# Patient Record
Sex: Female | Born: 1940 | Race: White | Hispanic: No | Marital: Single
Health system: Southern US, Community
[De-identification: ages and names within clinical notes are randomized; demographics above are authoritative.]

## PROBLEM LIST (undated history)

## (undated) DIAGNOSIS — K567 Ileus, unspecified: Secondary | ICD-10-CM

## (undated) DIAGNOSIS — A419 Sepsis, unspecified organism: Secondary | ICD-10-CM

## (undated) DIAGNOSIS — I10 Essential (primary) hypertension: Secondary | ICD-10-CM

## (undated) DIAGNOSIS — C20 Malignant neoplasm of rectum: Secondary | ICD-10-CM

## (undated) DIAGNOSIS — D75839 Thrombocytosis, unspecified: Secondary | ICD-10-CM

## (undated) DIAGNOSIS — I509 Heart failure, unspecified: Secondary | ICD-10-CM

## (undated) DIAGNOSIS — D473 Essential (hemorrhagic) thrombocythemia: Secondary | ICD-10-CM

---

## 2014-02-19 ENCOUNTER — Inpatient Hospital Stay
Admission: AD | Admit: 2014-02-19 | Discharge: 2014-02-24 | Disposition: A | Discharge status: Still In Hospital | Payer: Medicare Other | Source: Ambulatory Visit | Attending: Internal Medicine | Admitting: Internal Medicine

## 2014-02-20 ENCOUNTER — Other Ambulatory Visit (HOSPITAL_COMMUNITY): Payer: Medicare Other

## 2014-02-20 LAB — CBC
HEMATOCRIT: 22.1 % — AB (ref 36.0–46.0)
Hemoglobin: 7.7 g/dL — ABNORMAL LOW (ref 12.0–15.0)
MCH: 31.7 pg (ref 26.0–34.0)
MCHC: 34.8 g/dL (ref 30.0–36.0)
MCV: 90.9 fL (ref 78.0–100.0)
Platelets: 52 10*3/uL — ABNORMAL LOW (ref 150–400)
RBC: 2.43 MIL/uL — AB (ref 3.87–5.11)
RDW: 20.9 % — ABNORMAL HIGH (ref 11.5–15.5)
WBC: 9.2 10*3/uL (ref 4.0–10.5)

## 2014-02-20 LAB — COMPREHENSIVE METABOLIC PANEL
ALK PHOS: 58 U/L (ref 39–117)
ALT: 38 U/L — ABNORMAL HIGH (ref 0–35)
AST: 66 U/L — ABNORMAL HIGH (ref 0–37)
Albumin: 3.1 g/dL — ABNORMAL LOW (ref 3.5–5.2)
Anion gap: 9 (ref 5–15)
BILIRUBIN TOTAL: 0.5 mg/dL (ref 0.3–1.2)
BUN: 27 mg/dL — ABNORMAL HIGH (ref 6–23)
CHLORIDE: 106 meq/L (ref 96–112)
CO2: 26 mEq/L (ref 19–32)
Calcium: 7.6 mg/dL — ABNORMAL LOW (ref 8.4–10.5)
Creatinine, Ser: 0.36 mg/dL — ABNORMAL LOW (ref 0.50–1.10)
GFR calc non Af Amer: >90 mL/min (ref >90)
GLUCOSE: 220 mg/dL — AB (ref 70–99)
POTASSIUM: 3.2 meq/L — AB (ref 3.7–5.3)
Sodium: 141 mEq/L (ref 137–147)
Total Protein: 4.1 g/dL — ABNORMAL LOW (ref 6.0–8.3)

## 2014-02-20 LAB — TSH: TSH: 3.64 u[IU]/mL (ref 0.350–4.500)

## 2014-02-20 LAB — PREALBUMIN: Prealbumin: 18.6 mg/dL (ref 17.0–34.0)

## 2014-02-21 ENCOUNTER — Other Ambulatory Visit (HOSPITAL_COMMUNITY): Payer: Medicare Other

## 2014-02-21 LAB — CBC
HEMATOCRIT: 22.6 % — AB (ref 36.0–46.0)
Hemoglobin: 7.6 g/dL — ABNORMAL LOW (ref 12.0–15.0)
MCH: 31.5 pg (ref 26.0–34.0)
MCHC: 33.6 g/dL (ref 30.0–36.0)
MCV: 93.8 fL (ref 78.0–100.0)
Platelets: 51 10*3/uL — ABNORMAL LOW (ref 150–400)
RBC: 2.41 MIL/uL — AB (ref 3.87–5.11)
RDW: 21 % — ABNORMAL HIGH (ref 11.5–15.5)
WBC: 8.2 10*3/uL (ref 4.0–10.5)

## 2014-02-21 LAB — BASIC METABOLIC PANEL
Anion gap: 9 (ref 5–15)
BUN: 31 mg/dL — ABNORMAL HIGH (ref 6–23)
CALCIUM: 7.3 mg/dL — AB (ref 8.4–10.5)
CO2: 28 meq/L (ref 19–32)
Chloride: 103 mEq/L (ref 96–112)
Creatinine, Ser: 0.35 mg/dL — ABNORMAL LOW (ref 0.50–1.10)
GFR calc Af Amer: >90 mL/min (ref >90)
GFR calc non Af Amer: >90 mL/min (ref >90)
GLUCOSE: 221 mg/dL — AB (ref 70–99)
Potassium: 3 mEq/L — ABNORMAL LOW (ref 3.7–5.3)
Sodium: 140 mEq/L (ref 137–147)

## 2014-02-22 LAB — COMPREHENSIVE METABOLIC PANEL
ALBUMIN: 3.4 g/dL — AB (ref 3.5–5.2)
ALT: 42 U/L — AB (ref 0–35)
AST: 53 U/L — AB (ref 0–37)
Alkaline Phosphatase: 60 U/L (ref 39–117)
Anion gap: 12 (ref 5–15)
BUN: 32 mg/dL — ABNORMAL HIGH (ref 6–23)
CALCIUM: 7.9 mg/dL — AB (ref 8.4–10.5)
CO2: 27 mEq/L (ref 19–32)
CREATININE: 0.32 mg/dL — AB (ref 0.50–1.10)
Chloride: 103 mEq/L (ref 96–112)
GFR calc Af Amer: >90 mL/min (ref >90)
GFR calc non Af Amer: >90 mL/min (ref >90)
Glucose, Bld: 209 mg/dL — ABNORMAL HIGH (ref 70–99)
Potassium: 2.9 mEq/L — CL (ref 3.7–5.3)
Sodium: 142 mEq/L (ref 137–147)
Total Bilirubin: 0.5 mg/dL (ref 0.3–1.2)
Total Protein: 4.4 g/dL — ABNORMAL LOW (ref 6.0–8.3)

## 2014-02-22 LAB — MAGNESIUM: MAGNESIUM: 1.7 mg/dL (ref 1.5–2.5)

## 2014-02-22 LAB — PHOSPHORUS: PHOSPHORUS: 1.9 mg/dL — AB (ref 2.3–4.6)

## 2014-02-23 ENCOUNTER — Other Ambulatory Visit (HOSPITAL_COMMUNITY): Payer: Medicare Other

## 2014-02-23 LAB — BASIC METABOLIC PANEL
ANION GAP: 11 (ref 5–15)
BUN: 37 mg/dL — ABNORMAL HIGH (ref 6–23)
CHLORIDE: 107 meq/L (ref 96–112)
CO2: 27 meq/L (ref 19–32)
Calcium: 8.4 mg/dL (ref 8.4–10.5)
Creatinine, Ser: 0.34 mg/dL — ABNORMAL LOW (ref 0.50–1.10)
GFR calc non Af Amer: >90 mL/min (ref >90)
Glucose, Bld: 217 mg/dL — ABNORMAL HIGH (ref 70–99)
POTASSIUM: 5 meq/L (ref 3.7–5.3)
SODIUM: 145 meq/L (ref 137–147)

## 2014-02-23 LAB — BLOOD GAS, ARTERIAL
ACID-BASE EXCESS: 5.3 mmol/L — AB (ref 0.0–2.0)
Bicarbonate: 28.9 mEq/L — ABNORMAL HIGH (ref 20.0–24.0)
O2 CONTENT: 3 L/min
O2 SAT: 92.4 %
PCO2 ART: 38.4 mmHg (ref 35.0–45.0)
Patient temperature: 97.6
TCO2: 30.1 mmol/L (ref 0–100)
pH, Arterial: 7.486 — ABNORMAL HIGH (ref 7.350–7.450)
pO2, Arterial: 62 mmHg — ABNORMAL LOW (ref 80.0–100.0)

## 2014-02-23 LAB — MAGNESIUM: MAGNESIUM: 2 mg/dL (ref 1.5–2.5)

## 2014-02-23 LAB — PHOSPHORUS: PHOSPHORUS: 2.8 mg/dL (ref 2.3–4.6)

## 2014-02-24 ENCOUNTER — Encounter (HOSPITAL_COMMUNITY): Payer: Self-pay | Admitting: Emergency Medicine

## 2014-02-24 ENCOUNTER — Other Ambulatory Visit (HOSPITAL_COMMUNITY): Payer: Medicare Other

## 2014-02-24 ENCOUNTER — Emergency Department (HOSPITAL_COMMUNITY): Payer: Medicare Other

## 2014-02-24 ENCOUNTER — Inpatient Hospital Stay (HOSPITAL_COMMUNITY)
Admission: EM | Admit: 2014-02-24 | Discharge: 2014-03-13 | DRG: 871 | Disposition: E | Payer: Medicare Other | Source: Other Acute Inpatient Hospital | Attending: Critical Care Medicine | Admitting: Critical Care Medicine

## 2014-02-24 DIAGNOSIS — Z9221 Personal history of antineoplastic chemotherapy: Secondary | ICD-10-CM | POA: Diagnosis not present

## 2014-02-24 DIAGNOSIS — J9600 Acute respiratory failure, unspecified whether with hypoxia or hypercapnia: Secondary | ICD-10-CM | POA: Diagnosis present

## 2014-02-24 DIAGNOSIS — K668 Other specified disorders of peritoneum: Secondary | ICD-10-CM | POA: Diagnosis present

## 2014-02-24 DIAGNOSIS — I214 Non-ST elevation (NSTEMI) myocardial infarction: Secondary | ICD-10-CM | POA: Diagnosis present

## 2014-02-24 DIAGNOSIS — I10 Essential (primary) hypertension: Secondary | ICD-10-CM | POA: Diagnosis present

## 2014-02-24 DIAGNOSIS — I509 Heart failure, unspecified: Secondary | ICD-10-CM | POA: Diagnosis present

## 2014-02-24 DIAGNOSIS — D696 Thrombocytopenia, unspecified: Secondary | ICD-10-CM | POA: Diagnosis present

## 2014-02-24 DIAGNOSIS — R6520 Severe sepsis without septic shock: Secondary | ICD-10-CM

## 2014-02-24 DIAGNOSIS — D62 Acute posthemorrhagic anemia: Secondary | ICD-10-CM | POA: Diagnosis present

## 2014-02-24 DIAGNOSIS — Z66 Do not resuscitate: Secondary | ICD-10-CM | POA: Diagnosis present

## 2014-02-24 DIAGNOSIS — C20 Malignant neoplasm of rectum: Secondary | ICD-10-CM | POA: Diagnosis present

## 2014-02-24 DIAGNOSIS — Z923 Personal history of irradiation: Secondary | ICD-10-CM

## 2014-02-24 DIAGNOSIS — E872 Acidosis, unspecified: Secondary | ICD-10-CM | POA: Diagnosis present

## 2014-02-24 DIAGNOSIS — Z79899 Other long term (current) drug therapy: Secondary | ICD-10-CM | POA: Diagnosis not present

## 2014-02-24 DIAGNOSIS — N179 Acute kidney failure, unspecified: Secondary | ICD-10-CM | POA: Diagnosis present

## 2014-02-24 DIAGNOSIS — R69 Illness, unspecified: Secondary | ICD-10-CM

## 2014-02-24 DIAGNOSIS — R1011 Right upper quadrant pain: Secondary | ICD-10-CM | POA: Diagnosis present

## 2014-02-24 DIAGNOSIS — A419 Sepsis, unspecified organism: Secondary | ICD-10-CM | POA: Diagnosis present

## 2014-02-24 DIAGNOSIS — K631 Perforation of intestine (nontraumatic): Secondary | ICD-10-CM | POA: Diagnosis present

## 2014-02-24 DIAGNOSIS — Z515 Encounter for palliative care: Secondary | ICD-10-CM

## 2014-02-24 DIAGNOSIS — D689 Coagulation defect, unspecified: Secondary | ICD-10-CM | POA: Diagnosis present

## 2014-02-24 DIAGNOSIS — R7309 Other abnormal glucose: Secondary | ICD-10-CM | POA: Diagnosis present

## 2014-02-24 DIAGNOSIS — E876 Hypokalemia: Secondary | ICD-10-CM | POA: Diagnosis present

## 2014-02-24 DIAGNOSIS — R6521 Severe sepsis with septic shock: Secondary | ICD-10-CM

## 2014-02-24 DIAGNOSIS — G9341 Metabolic encephalopathy: Secondary | ICD-10-CM | POA: Diagnosis present

## 2014-02-24 DIAGNOSIS — R198 Other specified symptoms and signs involving the digestive system and abdomen: Secondary | ICD-10-CM

## 2014-02-24 DIAGNOSIS — K659 Peritonitis, unspecified: Secondary | ICD-10-CM | POA: Diagnosis present

## 2014-02-24 HISTORY — DX: Essential (primary) hypertension: I10

## 2014-02-24 HISTORY — DX: Heart failure, unspecified: I50.9

## 2014-02-24 HISTORY — DX: Sepsis, unspecified organism: A41.9

## 2014-02-24 HISTORY — DX: Ileus, unspecified: K56.7

## 2014-02-24 HISTORY — DX: Essential (hemorrhagic) thrombocythemia: D47.3

## 2014-02-24 HISTORY — DX: Thrombocytosis, unspecified: D75.839

## 2014-02-24 HISTORY — DX: Malignant neoplasm of rectum: C20

## 2014-02-24 LAB — I-STAT ARTERIAL BLOOD GAS, ED
ACID-BASE DEFICIT: 1 mmol/L (ref 0.0–2.0)
ACID-BASE DEFICIT: 3 mmol/L — AB (ref 0.0–2.0)
Bicarbonate: 27 mEq/L — ABNORMAL HIGH (ref 20.0–24.0)
Bicarbonate: 23.5 mEq/L (ref 20.0–24.0)
O2 SAT: 99 %
O2 SAT: 94 %
PO2 ART: 197 mmHg — AB (ref 80.0–100.0)
PO2 ART: 75 mmHg — AB (ref 80.0–100.0)
TCO2: 29 mmol/L (ref 0–100)
TCO2: 25 mmol/L (ref 0–100)
pCO2 arterial: 67.2 mmHg (ref 35.0–45.0)
pCO2 arterial: 45.8 mmHg — ABNORMAL HIGH (ref 35.0–45.0)
pH, Arterial: 7.212 — ABNORMAL LOW (ref 7.350–7.450)
pH, Arterial: 7.32 — ABNORMAL LOW (ref 7.350–7.450)

## 2014-02-24 LAB — COMPREHENSIVE METABOLIC PANEL
ALK PHOS: 35 U/L — AB (ref 39–117)
ALT: 28 U/L (ref 0–35)
ANION GAP: 7 (ref 5–15)
AST: 22 U/L (ref 0–37)
Albumin: 1.5 g/dL — ABNORMAL LOW (ref 3.5–5.2)
BUN: 36 mg/dL — AB (ref 6–23)
CHLORIDE: 115 meq/L — AB (ref 96–112)
CO2: 19 mEq/L (ref 19–32)
CREATININE: 0.42 mg/dL — AB (ref 0.50–1.10)
Calcium: 5.1 mg/dL — CL (ref 8.4–10.5)
GFR calc non Af Amer: >90 mL/min (ref >90)
Glucose, Bld: 185 mg/dL — ABNORMAL HIGH (ref 70–99)
Potassium: 3.2 mEq/L — ABNORMAL LOW (ref 3.7–5.3)
Sodium: 141 mEq/L (ref 137–147)
Total Protein: 2.1 g/dL — ABNORMAL LOW (ref 6.0–8.3)

## 2014-02-24 LAB — APTT: APTT: 34 s (ref 24–37)

## 2014-02-24 LAB — CBC
HEMATOCRIT: 19.7 % — AB (ref 36.0–46.0)
Hemoglobin: 6.3 g/dL — CL (ref 12.0–15.0)
MCH: 31.5 pg (ref 26.0–34.0)
MCHC: 32 g/dL (ref 30.0–36.0)
MCV: 98.5 fL (ref 78.0–100.0)
Platelets: 49 10*3/uL — ABNORMAL LOW (ref 150–400)
RBC: 2 MIL/uL — ABNORMAL LOW (ref 3.87–5.11)
RDW: 21 % — ABNORMAL HIGH (ref 11.5–15.5)
WBC: 4.9 10*3/uL (ref 4.0–10.5)

## 2014-02-24 LAB — PROTIME-INR
INR: 1.9 — ABNORMAL HIGH (ref 0.00–1.49)
PROTHROMBIN TIME: 21.8 s — AB (ref 11.6–15.2)

## 2014-02-24 LAB — I-STAT TROPONIN, ED: TROPONIN I, POC: 0.12 ng/mL — AB (ref 0.00–0.08)

## 2014-02-24 LAB — LACTIC ACID, PLASMA: Lactic Acid, Venous: 1.1 mmol/L (ref 0.5–2.2)

## 2014-02-24 LAB — GLUCOSE, CAPILLARY: Glucose-Capillary: 115 mg/dL — ABNORMAL HIGH (ref 70–99)

## 2014-02-24 LAB — CBG MONITORING, ED: Glucose-Capillary: 143 mg/dL — ABNORMAL HIGH (ref 70–99)

## 2014-02-24 LAB — CALCIUM, IONIZED: CALCIUM ION: 1.18 mmol/L (ref 1.13–1.30)

## 2014-02-24 MED ORDER — SODIUM CHLORIDE 0.9 % IV BOLUS (SEPSIS)
2000.0000 mL | Freq: Once | INTRAVENOUS | Status: AC
  Administered 2014-02-24: 2000 mL via INTRAVENOUS

## 2014-02-24 MED ORDER — MORPHINE SULFATE 4 MG/ML IJ SOLN
4.0000 mg | Freq: Once | INTRAMUSCULAR | Status: AC
  Administered 2014-02-24: 4 mg via INTRAVENOUS
  Filled 2014-02-24: qty 1

## 2014-02-24 MED ORDER — MIDAZOLAM HCL 2 MG/2ML IJ SOLN
2.0000 mg | Freq: Once | INTRAMUSCULAR | Status: AC
  Administered 2014-02-24: 2 mg via INTRAVENOUS
  Filled 2014-02-24: qty 2

## 2014-02-24 MED ORDER — NOREPINEPHRINE BITARTRATE 1 MG/ML IV SOLN: 2.0000 ug/min | Freq: Once | INTRAVENOUS | Status: DC

## 2014-02-24 MED ORDER — DEXTROSE 5 % IV SOLN: INTRAVENOUS | Status: DC

## 2014-02-24 MED ORDER — MORPHINE SULFATE 4 MG/ML IJ SOLN: 4.0000 mg | INTRAMUSCULAR | Status: DC | PRN

## 2014-02-24 MED ORDER — SODIUM CHLORIDE 0.9 % IV SOLN
3.0000 g | Freq: Once | INTRAVENOUS | Status: AC
  Administered 2014-02-24: 3 g via INTRAVENOUS
  Filled 2014-02-24: qty 3

## 2014-02-24 MED ORDER — ETOMIDATE 2 MG/ML IV SOLN
INTRAVENOUS | Status: AC | PRN
  Administered 2014-02-24: 20 mg via INTRAVENOUS

## 2014-02-24 MED ORDER — ROCURONIUM BROMIDE 50 MG/5ML IV SOLN
INTRAVENOUS | Status: AC
  Filled 2014-02-24: qty 2

## 2014-02-24 MED ORDER — SUCCINYLCHOLINE CHLORIDE 20 MG/ML IJ SOLN
INTRAMUSCULAR | Status: AC
  Filled 2014-02-24: qty 1

## 2014-02-24 MED ORDER — NOREPINEPHRINE BITARTRATE 1 MG/ML IV SOLN
10.0000 ug/min | INTRAVENOUS | Status: DC
  Administered 2014-02-24: 10 ug/min via INTRAVENOUS
  Filled 2014-02-24: qty 4

## 2014-02-24 MED ORDER — MORPHINE BOLUS VIA INFUSION
5.0000 mg | INTRAVENOUS | Status: DC | PRN
  Filled 2014-02-24: qty 20

## 2014-02-24 MED ORDER — SODIUM CHLORIDE 0.9 % IV SOLN: 250.0000 mL | INTRAVENOUS | Status: DC | PRN

## 2014-02-24 MED ORDER — SUCCINYLCHOLINE CHLORIDE 20 MG/ML IJ SOLN
INTRAMUSCULAR | Status: AC | PRN
  Administered 2014-02-24: 100 mg via INTRAVENOUS

## 2014-02-24 MED ORDER — LIDOCAINE HCL (CARDIAC) 20 MG/ML IV SOLN
INTRAVENOUS | Status: AC
  Filled 2014-02-24: qty 5

## 2014-02-24 MED ORDER — MORPHINE SULFATE 10 MG/ML IJ SOLN
5.0000 mg/h | INTRAVENOUS | Status: DC
  Administered 2014-02-24: 5 mg/h via INTRAVENOUS
  Filled 2014-02-24: qty 10

## 2014-02-24 MED ORDER — MORPHINE SULFATE 10 MG/ML IJ SOLN
10.0000 mg/h | INTRAVENOUS | Status: DC
  Filled 2014-02-24: qty 10

## 2014-02-24 MED ORDER — ETOMIDATE 2 MG/ML IV SOLN
INTRAVENOUS | Status: AC
  Filled 2014-02-24: qty 20

## 2014-03-13 NOTE — Progress Notes (Signed)
Brief note:  30 F with rectal cancer, presumably extensive stage who was transferred from [facility] with abdominal pain in setting of likely bowel rupture. No surgical options per [facility]. Pt has no family , never married and no kids. HCPOA -friend Conni Slipper arrived to ICU , reviewed case again and plans for comfort care with terminal wean as no meaningful recovery. Pt remains unresponsive. Palliative comfort care orders .  Dr. Alva Garnet with assessment and discussion with Mr. Rosana Hoes.   Tammy Parrett NP-C  [place]  [phone]    Merton Border, MD ; [facility] service Mobile [phone].  After 5:30 PM or weekends, call [phone]

## 2014-03-13 NOTE — Progress Notes (Signed)
Nutrition Brief Note  Chart reviewed. Pt now transitioning to comfort care.  No further nutrition interventions warranted at this time.  Please consult as needed.   Terrace Arabia RD, LDN

## 2014-03-13 NOTE — ED Notes (Signed)
Pt brought down from [facility] located in hospital for evaluation of possible bowel perforation. Upon pt arrival pts SpO2: 84%, BP 60's/40's EDP manly notified. Pt unresponsive at this time. Pt has labored and shallow breathing.

## 2014-03-13 NOTE — Progress Notes (Signed)
Wasted 30mg  of morphine in Med room sink with [facility] howdershell RN

## 2014-03-13 NOTE — ED Notes (Signed)
EDP Manly instructed to use PICC line for medications.

## 2014-03-13 NOTE — Progress Notes (Signed)
Patient expired at 1120, absent of heart sounds, respirations. Family Friend at beside. Extubated from ventilator at 1045. Morphine gtt started about [place]

## 2014-03-13 NOTE — ED Notes (Signed)
Dr Cheri Guppy at the bedside attempting to start external jugular.

## 2014-03-13 NOTE — Progress Notes (Signed)
Dr. Cheri Guppy notified of ABG results. Vent settings changed per results, Dr. Cheri Guppy also aware. See settings in [place]

## 2014-03-13 NOTE — ED Notes (Signed)
Per Dr. Lowry Ram pt now a DNR and do not perform any advanced care.

## 2014-03-13 NOTE — ED Provider Notes (Signed)
CSN: [id number]     Arrival date & time 03/17/14  8527 History   First MD Initiated Contact with Patient Mar 17, 2014 [phone]     Chief Complaint  Patient presents with  . Abdominal Pain  . Hypotension     (Consider location/radiation/quality/duration/timing/severity/associated sxs/prior Treatment) HPI  Patient is an elderly woman who was brought to the ED from the [facility]. She has a history of advanced rectal CA and was admitted on August 10 with pseudo small bowel obstruction and diarrhea. Per her floor nurse, the patient has exhibited increasingly severe AMS over the past 24 hrs with increasing distension of the abdomen. Plain films of the abdomen were obtained on the floor and the patient was found to have a large amount of intraperitoneal free air with suspected perforated viscous.   Unable to obtain history from the patient. Her chart indicates that her code status is full code. We have attempted to reach her healthcare POA, Mr. Conni Slipper, and have been unsuccessful but, have left several messages.   Past Medical History  Diagnosis Date  . CHF (congestive heart failure)   . Rectal cancer   . HTN (hypertension)   . Ileus   . Sepsis   . Thrombocytosis    No past surgical history on file. No family history on file. History  Substance Use Topics  . Smoking status: Not on file  . Smokeless tobacco: Not on file  . Alcohol Use: Not on file   OB History   Grav Para Term Preterm Abortions TAB SAB Ect Mult Living                 Review of Systems  Unable to obtain secondary to AMS.   Allergies  Review of patient's allergies indicates not on file.  Home Medications   Prior to Admission medications   Medication Sig Start Date End Date Taking? Authorizing Provider  desipramine (NOPRAMIN) 10 MG tablet Take 10 mg by mouth 3 (three) times daily. 01/21/14  Yes Historical Provider, MD  pantoprazole (PROTONIX) 40 MG tablet Take 40 mg by mouth daily. 01/21/14  Yes Historical  Provider, MD  simvastatin (ZOCOR) 20 MG tablet Take 20 mg by mouth daily. 01/02/14  Yes Historical Provider, MD  amLODipine (NORVASC) 5 MG tablet Take 5 mg by mouth daily. 12/11/13   Historical Provider, MD  irbesartan-hydrochlorothiazide (AVALIDE) 150-12.5 MG per tablet Take 1 tablet by mouth daily. 01/04/14   Historical Provider, MD   BP 106/82  Pulse 88  Temp(Src) 96.8 F (36 C) (Core (Comment))  Resp 25  SpO2 100% Physical Exam  Gen: chronically ill appearing, unresponsive.  Head: NCAT Ears: normal to inspection Nose: normal to inspection, no epistaxis or drainage Mouth: edentulous, no lesions, no gag reflex Neck: supple, no stridor CV: RRR, no murmur, palpable peripheral pulses Resp: RR 28/min, bibasilar rales,  Abd: distended, tympanic Extremities: weeping anasarca of all 4 extremities, PIC line right UE.  Skin: hands and feet cool and dry.  Neuro: GCS 5 Psyche; unable to assess   ED Course  Procedures (including critical care time) Labs Review Labs Reviewed  CBC - Abnormal; Notable for the following:    RBC 2.00 (*)    Hemoglobin 6.3 (*)    HCT 19.7 (*)    RDW 21.0 (*)    Platelets 49 (*)    All other components within normal limits  COMPREHENSIVE METABOLIC PANEL - Abnormal; Notable for the following:    Potassium 3.2 (*)    Chloride  115 (*)    Glucose, Bld 185 (*)    BUN 36 (*)    Creatinine, Ser 0.42 (*)    Calcium 5.1 (*)    Total Protein 2.1 (*)    Albumin 1.5 (*)    Alkaline Phosphatase 35 (*)    Total Bilirubin <0.2 (*)    All other components within normal limits  PROTIME-INR - Abnormal; Notable for the following:    Prothrombin Time 21.8 (*)    INR 1.90 (*)    All other components within normal limits  I-STAT TROPOININ, ED - Abnormal; Notable for the following:    Troponin i, poc 0.12 (*)    All other components within normal limits  CBG MONITORING, ED - Abnormal; Notable for the following:    Glucose-Capillary 143 (*)    All other components  within normal limits  I-STAT ARTERIAL BLOOD GAS, ED - Abnormal; Notable for the following:    pH, Arterial 7.212 (*)    pCO2 arterial 67.2 (*)    pO2, Arterial 197.0 (*)    Bicarbonate 27.0 (*)    All other components within normal limits  I-STAT ARTERIAL BLOOD GAS, ED - Abnormal; Notable for the following:    pH, Arterial 7.320 (*)    pCO2 arterial 45.8 (*)    pO2, Arterial 75.0 (*)    Acid-base deficit 3.0 (*)    All other components within normal limits  CULTURE, BLOOD (ROUTINE X 2)  CULTURE, BLOOD (ROUTINE X 2)  LACTIC ACID, PLASMA  APTT  URINALYSIS, ROUTINE W REFLEX MICROSCOPIC    Imaging Review Dg Chest Port 1 View  2014-03-23   CLINICAL DATA:  Endotracheal tube placement.  EXAM: PORTABLE CHEST - 1 VIEW  COMPARISON:  Chest radiograph performed 02/23/2014, and abdominal radiograph performed earlier today at 12:23 a.m.  FINDINGS: The patient's endotracheal tube is seen ending 2-3 cm above the carina. The enteric tube is seen extending below the diaphragm. A right PICC is noted ending about the mid SVC.  Mild bibasilar opacities likely reflect atelectasis. No pleural effusion or pneumothorax is seen.  The cardiomediastinal silhouette is normal in size. No acute osseous abnormalities are identified.  Massive pneumoperitoneum is again noted, better characterized on recent abdominal radiograph.  IMPRESSION: 1. Endotracheal tube seen ending 2-3 cm above the carina. 2. Mild bibasilar airspace opacities likely reflect atelectasis; lungs otherwise grossly clear. 3. Massive pneumoperitoneum again noted.   Electronically Signed   By: Garald Balding M.D.   On: 03/23/14 05:02   Dg Chest Port 1 View  02/23/2014   CLINICAL DATA:  Pleural effusion, followup  EXAM: PORTABLE CHEST - 1 VIEW  COMPARISON:  Portable exam 1250 hr compared 02/20/2014  FINDINGS: RIGHT arm PICC line tip projects over mid SVC.  Normal heart size and mediastinal contours.  Rotated to the RIGHT.  Bibasilar pleural effusions and  atelectasis greater on RIGHT.  Upper lungs clear.  Central peribronchial thickening.  No pneumothorax or acute osseous findings.  IMPRESSION: Persistent bibasilar pleural effusions and atelectasis slightly greater on RIGHT.  Bronchitic changes.   Electronically Signed   By: Lavonia Dana M.D.   On: 02/23/2014 13:14   Dg Abd Portable 1v  2014/03/23   CLINICAL DATA:  Distended abdomen.  EXAM: PORTABLE ABDOMEN - 1 VIEW  COMPARISON:  02/21/2014  FINDINGS: There is large volume pneumoperitoneum now present, new since prior study. Gaseous distention of the colon has progressed since prior study. No organomegaly. Degenerative changes in the lumbar spine.  IMPRESSION:  Large volume pneumoperitoneum most compatible with bowel perforation.  Critical Value/emergent results were called by telephone at the time of interpretation on 2014-03-12 at 12:47 am to patient's nurse, Cristie Hem who verbally acknowledged these results.   Electronically Signed   By: Rolm Baptise M.D.   On: March 12, 2014 00:50    EKG: nsr, no acute ischemic changes, normal intervals, normal axis, normal qrs complex  INTUBATION Performed by: Elyn Peers  Required items: required blood products, implants, devices, and special equipment available Patient identity confirmed: provided demographic data and hospital-assigned identification number Time out: Immediately prior to procedure a "time out" was called to verify the correct patient, procedure, equipment, support staff and site/side marked as required.  Indications: altered mental status, sepsis  Intubation method: direct laryngoscopy  Preoxygenation:  100% via BVM  Sedatives: Etomidate Paralytic: Succinylcholine  Tube Size: 7.5  cuffed  Post-procedure assessment: chest rise and ETCO2 monitor Breath sounds: equal and absent over the epigastrium Tube secured with: ETT holder Chest x-ray interpreted by radiologist and me.  Chest x-ray findings: endotracheal tube in appropriate  position  Patient tolerated the procedure well with no immediate complications.   CRITICAL CARE Performed by: Elyn Peers   Total critical care time: 149m  Critical care time was exclusive of separately billable procedures and treating other patients.  Critical care was necessary to treat or prevent imminent or life-threatening deterioration.  Critical care was time spent personally by me on the following activities: development of treatment plan with patient and/or surrogate as well as nursing, discussions with consultants, evaluation of patient's response to treatment, examination of patient, obtaining history from patient or surrogate, ordering and performing treatments and interventions, ordering and review of laboratory studies, ordering and review of radiographic studies, pulse oximetry and re-evaluation of patient's condition.  Patient was fluid resuscitated and started on norepinephrine for septic shock. Empiric antibiotic tx with Unasyn for intra abdominal infection.    Dr.  Excell Seltzer has consulted on the patient and does not feel she is a surgical candidate in light of her co-morbidities, age and advanced rectal cancer along with her current state of septic shock. I concur.   Case discussed with Dr. Dederding of CCM. Her associate has evaluated the patient and feels she will be best served by comfort care measure. We were ultimately able to reach the patient's POA, Mr. Rosana Hoes. Both the CCM physician and I spoke with Mr. Rosana Hoes and confirmed her code status change to DNR with focus on comfort measure.   MDM   Final diagnoses:  Sepsis, due to unspecified organism  Perforated viscus        Elyn Peers, MD 03/12/2014 [phone]

## 2014-03-13 NOTE — H&P (Addendum)
PULMONARY / CRITICAL CARE MEDICINE HISTORY AND PHYSICAL EXAMINATION   Name: Tammie Frey MRN: [id number] DOB: [birth year]    ADMISSION DATE:  2014/03/22  PRIMARY SERVICE: PCCM  CHIEF COMPLAINT:  Abdominal Pain  BRIEF PATIENT DESCRIPTION: 81 F with rectal cancer, presumably extensive stage who was transferred from [facility] with abdominal pain in setting of likely bowel rupture. No surgical options. Intubated in ED, PCCM asked to admit. Patient is do not escalate care and will withdraw when HCPOA arrives at ~ 8:30 am.  SIGNIFICANT EVENTS / STUDIES:  Bowel rupture 8/15 Intubated 8/15  LINES / TUBES: R PICC  CULTURES: None  ANTIBIOTICS: Unasyn x 1 8/15  HISTORY OF PRESENT ILLNESS:  Tammie Frey is a 57 F with CHF (EF unknown), and rectal cancer (presumably extensive stage) who was admitted to [facility] on 02/19/2014. There is minimal documentation from [facility] currently available but by report patient complained of N and abdominal pain earlier today and was found to have massive pneumoperitonum on X-ray. She was transferred to the ED for further evaluation. Surgery evaluated her and felt she was not a candidate for any surgical interventions. She was intubated.   PAST MEDICAL HISTORY :  Past Medical History  Diagnosis Date  . CHF (congestive heart failure)   . Rectal cancer   . HTN (hypertension)   . Ileus   . Sepsis   . Thrombocytosis    No past surgical history on file. Prior to Admission medications   Medication Sig Start Date End Date Taking? Authorizing Provider  desipramine (NOPRAMIN) 10 MG tablet Take 10 mg by mouth 3 (three) times daily. 01/21/14  Yes Historical Provider, MD  pantoprazole (PROTONIX) 40 MG tablet Take 40 mg by mouth daily. 01/21/14  Yes Historical Provider, MD  simvastatin (ZOCOR) 20 MG tablet Take 20 mg by mouth daily. 01/02/14  Yes Historical Provider, MD  amLODipine (NORVASC) 5 MG tablet Take 5 mg by mouth daily. 12/11/13   Historical Provider, MD   irbesartan-hydrochlorothiazide (AVALIDE) 150-12.5 MG per tablet Take 1 tablet by mouth daily. 01/04/14   Historical Provider, MD   Not on File  FAMILY HISTORY:  No family history on file. SOCIAL HISTORY:  has no tobacco, alcohol, and drug history on file.  REVIEW OF SYSTEMS:  Unable to obtain secondary to patient condition.  SUBJECTIVE:   VITAL SIGNS: Temp:  [96.8 F (36 C)] 96.8 F (36 C) (08/15 0430) Pulse Rate:  [83-104] 88 (08/15 0550) Resp:  [0-31] 25 (08/15 0550) BP: (57-120)/(30-82) 106/82 mmHg (08/15 0550) SpO2:  [83 %-100 %] 100 % (08/15 0550) FiO2 (%):  [50 %-100 %] 50 % (08/15 0438) HEMODYNAMICS:   VENTILATOR SETTINGS: Vent Mode:  [-] PRVC FiO2 (%):  [50 %-100 %] 50 % Set Rate:  [14 bmp-25 bmp] 25 bmp Vt Set:  [420 mL] 420 mL PEEP:  [5 cmH20] 5 cmH20 Plateau Pressure:  [25 cmH20] 25 cmH20 INTAKE / OUTPUT: Intake/Output   None     PHYSICAL EXAMINATION: General:  Elderly F on ventilator Neuro:  Non-responsive, no sedatives HEENT:  Pupils reactive, sclera anicteric, conjunctiva pink, ETT present Neck: Trachea supple and midline, (-) LAN or JVD Cardiovascular:  RRR, NS1/S2, (-) MRG Lungs:  Coarse mechanical BS bilaterally Abdomen:  Tense, (-) BS Musculoskeletal:  3+ pitting edema to shins bilaterally Skin:  Grossly intact  LABS:  CBC  Recent Labs Lab 02/20/14 0604 02/21/14 0600 03-22-2014 0407  WBC 9.2 8.2 4.9  HGB 7.7* 7.6* 6.3*  HCT 22.1* 22.6* 19.7*  PLT 52* 51* 49*   Coag's  Recent Labs Lab March 24, 2014 0407  APTT 34  INR 1.90*   BMET  Recent Labs Lab 02/22/14 0651 02/23/14 0405 March 24, 2014 0407  NA 142 145 141  K 2.9* 5.0 3.2*  CL 103 107 115*  CO2 27 27 19   BUN 32* 37* 36*  CREATININE 0.32* 0.34* 0.42*  GLUCOSE 209* 217* 185*   Electrolytes  Recent Labs Lab 02/22/14 0651 02/23/14 0405 24-Mar-2014 0407  CALCIUM 7.9* 8.4 5.1*  MG 1.7 2.0  --   PHOS 1.9* 2.8  --    Sepsis Markers  Recent Labs Lab 2014-03-24 0407   LATICACIDVEN 1.1   ABG  Recent Labs Lab 02/23/14 1235 2014/03/24 0428 2014/03/24 0529  PHART 7.486* 7.212* 7.320*  PCO2ART 38.4 67.2* 45.8*  PO2ART 62.0* 197.0* 75.0*   Liver Enzymes  Recent Labs Lab 02/20/14 0604 02/22/14 0651 03-24-14 0407  AST 66* 53* 22  ALT 38* 42* 28  ALKPHOS 58 60 35*  BILITOT 0.5 0.5 <0.2*  ALBUMIN 3.1* 3.4* 1.5*   Cardiac Enzymes No results found for this basename: TROPONINI, PROBNP,  in the last 168 hours Glucose  Recent Labs Lab Mar 24, 2014 0357  GLUCAP 143*    Imaging Dg Chest Port 1 View  03-24-14   CLINICAL DATA:  Endotracheal tube placement.  EXAM: PORTABLE CHEST - 1 VIEW  COMPARISON:  Chest radiograph performed 02/23/2014, and abdominal radiograph performed earlier today at 12:23 a.m.  FINDINGS: The patient's endotracheal tube is seen ending 2-3 cm above the carina. The enteric tube is seen extending below the diaphragm. A right PICC is noted ending about the mid SVC.  Mild bibasilar opacities likely reflect atelectasis. No pleural effusion or pneumothorax is seen.  The cardiomediastinal silhouette is normal in size. No acute osseous abnormalities are identified.  Massive pneumoperitoneum is again noted, better characterized on recent abdominal radiograph.  IMPRESSION: 1. Endotracheal tube seen ending 2-3 cm above the carina. 2. Mild bibasilar airspace opacities likely reflect atelectasis; lungs otherwise grossly clear. 3. Massive pneumoperitoneum again noted.   Electronically Signed   By: Garald Balding M.D.   On: 2014/03/24 05:02   Dg Chest Port 1 View  02/23/2014   CLINICAL DATA:  Pleural effusion, followup  EXAM: PORTABLE CHEST - 1 VIEW  COMPARISON:  Portable exam 1250 hr compared 02/20/2014  FINDINGS: RIGHT arm PICC line tip projects over mid SVC.  Normal heart size and mediastinal contours.  Rotated to the RIGHT.  Bibasilar pleural effusions and atelectasis greater on RIGHT.  Upper lungs clear.  Central peribronchial thickening.  No  pneumothorax or acute osseous findings.  IMPRESSION: Persistent bibasilar pleural effusions and atelectasis slightly greater on RIGHT.  Bronchitic changes.   Electronically Signed   By: Lavonia Dana M.D.   On: 02/23/2014 13:14   Dg Abd Portable 1v  2014/03/24   CLINICAL DATA:  Distended abdomen.  EXAM: PORTABLE ABDOMEN - 1 VIEW  COMPARISON:  02/21/2014  FINDINGS: There is large volume pneumoperitoneum now present, new since prior study. Gaseous distention of the colon has progressed since prior study. No organomegaly. Degenerative changes in the lumbar spine.  IMPRESSION: Large volume pneumoperitoneum most compatible with bowel perforation.  Critical Value/emergent results were called by telephone at the time of interpretation on 24-Mar-2014 at 12:47 am to patient's nurse, Cristie Hem who verbally acknowledged these results.   Electronically Signed   By: Rolm Baptise M.D.   On: 03/24/2014 00:50    EKG: None performed today. CXR: Personally reviewed  ASSESSMENT / PLAN:  Active Problems:   * No active hospital problems. *   PULMONARY A: Acute Respiratory Failure: Presumably 2/2 worsening acidosis in setting bowel rupture. P:   Continue current vent settings pending withdrawal  CARDIOVASCULAR A: Septic Shock:  NSTEMI:  CHF: P:   Continue current levo dose pending withdrawal of care. Do not titrate.  RENAL A: Acute Renal Injury Hypokalemia:  P:   No acute intervention planned  GASTROINTESTINAL A: Bowel perforation: Seen by surgery - no options.  P:     HEMATOLOGIC A: Presumed Acute Hemorrhage:  Thrombocytopenia: Elevated INR P:   No acute intervention planned  INFECTIOUS A: Septic Shock:  P:   No acute intervention planned  ENDOCRINE A: Hypergylcemia:  P:   No acute intervention planned  NEUROLOGIC A: AMS: Suspect 2/2 septic shock P:   Morphine for pain control  GOC: I spoke with Tammie Frey's listed emergency, Carrie Mew, at [phone]. She was unwilling to  help make medical decisions for Tammie Frey but did confirm that Tammie Frey does not have family. She put [place] in touch with her Sharen Hones [phone], who I was able to reach after multiple attempts. We discussed her dismal prognosis; we decided to focus on her comfort. He is travelling here now and should be here around 8:30 am. In the interim we agreed to not escalate care and make her DNR. We will withdraw care once he arrives. Dr. Cheri Guppy confirmed the content of our call over the phone.    BEST PRACTICE / DISPOSITION Level of Care:  ICU Primary Service:  PCCM Consultants:  Surgery Code Status:  DNR Diet:  NPO DVT Px:  None (Comfort Care) GI Px:  None (Comfort Care) Skin Integrity:  Intact Social / Family:  POA updated as above  TODAY'S SUMMARY:   I have personally obtained a history, examined the patient, evaluated laboratory and imaging results, formulated the assessment and plan and placed orders.  CRITICAL CARE: The patient is critically ill with multiple organ systems failure and requires high complexity decision making for assessment and support, frequent evaluation and titration of therapies, application of advanced monitoring technologies and extensive interpretation of multiple databases. Critical Care Time devoted to patient care services described in this note is 90 minutes.   Margarette Asal, MD Pulmonary and [place] Pager: [phone]   03-24-14, 6:06 AM

## 2014-03-13 NOTE — Procedures (Signed)
Extubation Procedure Note  Patient Details:   Name: Tammie Frey DOB: May 30, 1941 MRN: [id number]   Airway Documentation:     Evaluation  O2 sats: pt extubated for withdrawal of life -comfort care Complications: No apparent complications Patient did tolerate procedure well. Bilateral Breath Sounds: Clear Suctioning: Airway No, pt not able to speak.  Pt extubated for withdrawal of life- comfort care.   Pt appeared comfortable t/o vent weaning, no resp distress noted.  RN at bedside.    Lenna Sciara 02/22/2014, 10:50 AM

## 2014-03-13 NOTE — Consult Note (Signed)
Reason for Consult:  Free air on abdominal x-ray Referring Physician: EDP  Tammie Frey is an 73 y.o. female.   HPI: Patient is a 73 year old female transferred from [facility] To the emergency department for evaluation. The patient currently is intubated and unresponsive. History is obtained [facility] and the emergency department physician. The [facility]. I cannot determine where she was admitted from. She apparently has had declining mental status for the past 2 days and was sent to the emergency department for evaluation. She has been found to have free air on her abdominal x-rays as below.  The patient was completely unresponsive to pain on arrival to the emergency department and was intubated. She has been hypotensive and remains unresponsive.   Past Medical History  Diagnosis Date  . CHF (congestive heart failure)   . Rectal cancer   . HTN (hypertension)   . Ileus   . Sepsis   . Thrombocytosis     Past medical history: All I have to work from is an incomplete history and physical [facility] but does not list past history and there is no family or other source of information. 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  No family history on file.  Social History:  has no tobacco, alcohol, and drug history on file.  Allergies: Not on File  Current Facility-Administered Medications  Medication Dose Route Frequency Provider Last Rate Last Dose  . 0.9 %  sodium chloride infusion  250 mL Intravenous PRN Mariea Clonts, MD      . etomidate (AMIDATE) 2 MG/ML injection           . lidocaine (cardiac) 100 mg/39ml (XYLOCAINE) 20 MG/ML injection 2%           . morphine 100 mg in dextrose 5 % 100 mL (1 mg/mL) infusion  5 mg/hr Intravenous Continuous Mariea Clonts, MD      . morphine 4 MG/ML injection 4 mg  4 mg Intravenous Q5 min  PRN Mariea Clonts, MD      . norepinephrine (LEVOPHED) 4 mg in dextrose 5 % 250 mL infusion  10 mcg/min Intravenous Titrated Elyn Peers, MD 93.8 mL/hr at 03-09-2014 0547 25 mcg/min at March 09, 2014 0547  . rocuronium (ZEMURON) 50 MG/5ML injection           . succinylcholine (ANECTINE) 20 MG/ML injection            Current Outpatient Prescriptions  Medication Sig Dispense Refill  . desipramine (NOPRAMIN) 10 MG tablet Take 10 mg by mouth 3 (three) times daily.      . pantoprazole (PROTONIX) 40 MG tablet Take 40 mg by mouth daily.      . simvastatin (ZOCOR) 20 MG tablet Take 20 mg by mouth daily.      Marland Kitchen amLODipine (NORVASC) 5 MG tablet Take 5 mg by mouth daily.      . irbesartan-hydrochlorothiazide (AVALIDE) 150-12.5 MG per tablet Take 1 tablet by mouth daily.         Results for orders placed during the hospital encounter of 2014/03/09 (from the past 48 hour(s))  CBG MONITORING, ED     Status: Abnormal   Collection Time    09-Mar-2014  3:57 AM      Result Value Ref Range   Glucose-Capillary 143 (*) 70 - 99 mg/dL   Comment 1 Notify RN  Comment 2 Documented in Chart    LACTIC ACID, PLASMA     Status: None   Collection Time    2014/03/12  4:07 AM      Result Value Ref Range   Lactic Acid, Venous 1.1  0.5 - 2.2 mmol/L  CBC     Status: Abnormal   Collection Time    12-Mar-2014  4:07 AM      Result Value Ref Range   WBC 4.9  4.0 - 10.5 K/uL   RBC 2.00 (*) 3.87 - 5.11 MIL/uL   Hemoglobin 6.3 (*) 12.0 - 15.0 g/dL   Comment: CRITICAL RESULT CALLED TO, READ BACK BY AND VERIFIED WITH:     M.BEASLEY RN 0503 03-12-14 E.GADDY   HCT 19.7 (*) 36.0 - 46.0 %   MCV 98.5  78.0 - 100.0 fL   MCH 31.5  26.0 - 34.0 pg   MCHC 32.0  30.0 - 36.0 g/dL   RDW 22.4 (*) 08.2 - 06.4 %   Platelets 49 (*) 150 - 400 K/uL   Comment: REPEATED TO VERIFY     SPECIMEN CHECKED FOR CLOTS     PLATELET COUNT CONFIRMED BY SMEAR  COMPREHENSIVE METABOLIC PANEL     Status: Abnormal   Collection Time    12-Mar-2014  4:07 AM       Result Value Ref Range   Sodium 141  137 - 147 mEq/L   Potassium 3.2 (*) 3.7 - 5.3 mEq/L   Comment: DELTA CHECK NOTED   Chloride 115 (*) 96 - 112 mEq/L   CO2 19  19 - 32 mEq/L   Glucose, Bld 185 (*) 70 - 99 mg/dL   BUN 36 (*) 6 - 23 mg/dL   Creatinine, Ser 9.49 (*) 0.50 - 1.10 mg/dL   Calcium 5.1 (*) 8.4 - 10.5 mg/dL   Comment: CRITICAL RESULT CALLED TO, READ BACK BY AND VERIFIED WITH:     FORREST,Z RN 03/12/14 0506 JORDANS     REPEATED TO VERIFY   Total Protein 2.1 (*) 6.0 - 8.3 g/dL   Albumin 1.5 (*) 3.5 - 5.2 g/dL   AST 22  0 - 37 U/L   ALT 28  0 - 35 U/L   Alkaline Phosphatase 35 (*) 39 - 117 U/L   Total Bilirubin <0.2 (*) 0.3 - 1.2 mg/dL   GFR calc non Af Amer >90  >90 mL/min   GFR calc Af Amer >90  >90 mL/min   Comment: (NOTE)     The eGFR has been calculated using the CKD EPI equation.     This calculation has not been validated in all clinical situations.     eGFR's persistently <90 mL/min signify possible Chronic Kidney     Disease.   Anion gap 7  5 - 15  PROTIME-INR     Status: Abnormal   Collection Time    03-12-2014  4:07 AM      Result Value Ref Range   Prothrombin Time 21.8 (*) 11.6 - 15.2 seconds   INR 1.90 (*) 0.00 - 1.49  APTT     Status: None   Collection Time    03/12/2014  4:07 AM      Result Value Ref Range   aPTT 34  24 - 37 seconds  I-STAT TROPOININ, ED     Status: Abnormal   Collection Time    03-12-2014  4:14 AM      Result Value Ref Range   Troponin i, poc 0.12 (*) 0.00 -  0.08 ng/mL   Comment NOTIFIED PHYSICIAN     Comment 3            Comment: Due to the release kinetics of cTnI,     a negative result within the first hours     of the onset of symptoms does not rule out     myocardial infarction with certainty.     If myocardial infarction is still suspected,     repeat the test at appropriate intervals.  I-STAT ARTERIAL BLOOD GAS, ED     Status: Abnormal   Collection Time    11-Mar-2014  4:28 AM      Result Value Ref Range   pH, Arterial  7.212 (*) 7.350 - 7.450   pCO2 arterial 67.2 (*) 35.0 - 45.0 mmHg   pO2, Arterial 197.0 (*) 80.0 - 100.0 mmHg   Bicarbonate 27.0 (*) 20.0 - 24.0 mEq/L   TCO2 29  0 - 100 mmol/L   O2 Saturation 99.0     Acid-base deficit 1.0  0.0 - 2.0 mmol/L   Collection site RADIAL, ALLEN'S TEST ACCEPTABLE     Drawn by Operator     Sample type ARTERIAL     Comment NOTIFIED PHYSICIAN      Dg Chest Port 1 View  11-Mar-2014   CLINICAL DATA:  Endotracheal tube placement.  EXAM: PORTABLE CHEST - 1 VIEW  COMPARISON:  Chest radiograph performed 02/23/2014, and abdominal radiograph performed earlier today at 12:23 a.m.  FINDINGS: The patient's endotracheal tube is seen ending 2-3 cm above the carina. The enteric tube is seen extending below the diaphragm. A right PICC is noted ending about the mid SVC.  Mild bibasilar opacities likely reflect atelectasis. No pleural effusion or pneumothorax is seen.  The cardiomediastinal silhouette is normal in size. No acute osseous abnormalities are identified.  Massive pneumoperitoneum is again noted, better characterized on recent abdominal radiograph.  IMPRESSION: 1. Endotracheal tube seen ending 2-3 cm above the carina. 2. Mild bibasilar airspace opacities likely reflect atelectasis; lungs otherwise grossly clear. 3. Massive pneumoperitoneum again noted.   Electronically Signed   By: Garald Balding M.D.   On: Mar 11, 2014 05:02   Dg Chest Port 1 View  02/23/2014   CLINICAL DATA:  Pleural effusion, followup  EXAM: PORTABLE CHEST - 1 VIEW  COMPARISON:  Portable exam 1250 hr compared 02/20/2014  FINDINGS: RIGHT arm PICC line tip projects over mid SVC.  Normal heart size and mediastinal contours.  Rotated to the RIGHT.  Bibasilar pleural effusions and atelectasis greater on RIGHT.  Upper lungs clear.  Central peribronchial thickening.  No pneumothorax or acute osseous findings.  IMPRESSION: Persistent bibasilar pleural effusions and atelectasis slightly greater on RIGHT.  Bronchitic  changes.   Electronically Signed   By: Lavonia Dana M.D.   On: 02/23/2014 13:14   Dg Abd Portable 1v  Mar 11, 2014   CLINICAL DATA:  Distended abdomen.  EXAM: PORTABLE ABDOMEN - 1 VIEW  COMPARISON:  02/21/2014  FINDINGS: There is large volume pneumoperitoneum now present, new since prior study. Gaseous distention of the colon has progressed since prior study. No organomegaly. Degenerative changes in the lumbar spine.  IMPRESSION: Large volume pneumoperitoneum most compatible with bowel perforation.  Critical Value/emergent results were called by telephone at the time of interpretation on 03-11-14 at 12:47 am to patient's nurse, Cristie Hem who verbally acknowledged these results.   Electronically Signed   By: Rolm Baptise M.D.   On: 03/11/14 00:50    Review of Systems  Unable to perform ROS  Blood pressure 93/51, pulse 91, resp. rate 12, SpO2 99.00%. Physical Exam General: Chronically and acutely ill appearing elderly Caucasian female who appears older than her age, Intubated and unresponsive Skin: Very atrophic skin diffusely multiple bruises and ecchymosed these over upper extremities HEENT: No palpable masses. Pupils are equal and minimally reactive. No icterus. Lungs: Bilateral rhonchi, intubated Cardiac: Systolic murmur. Regular rate and rhythm. 2+ lower extremity edema and moderate generalized anasarca. No palpable pulses Abdomen: Moderately distended. Bowel sounds absent. No apparent tenderness the patient completely unresponsive. No apparent incisions. Rectal: Poor tone. No palpable masses. Liquid brown stool Extremities: 2+ edema Neurologic: Intubated and unresponsive. Reportedly unresponsive prior to intubation. No response to pain.  Assessment/Plan: Patient with reportedly unresectable Rectal cancer with history of chemoradiation and then Ogilvie syndrome and inability to the, failure to thrive and admitted to [facility] for TNA and supportive care.  She now has perforated viscus.  There is marked colonic distention and possibilities include colonic perforation versus perforated ulcer. Patient is hypotensive and unresponsive. I do not believe she is a candidate for surgery. She has no meaningful chance of recovery from a laparotomy. There is unfortunately no family or apparent power of attorney that we're able to contact. There is no apparent living will. I've discussed this with the emergency department physician who also feels the patient  Is not a candidate for aggressive treatment such as surgery. She will be admitted by critical care medicine service for support and comfort care.   Etienne Millward T 2014-03-24, 5:31 AM

## 2014-03-13 NOTE — ED Notes (Addendum)
Troponin reported to Dr. Cheri Guppy

## 2014-03-13 NOTE — ED Notes (Signed)
EDP Manly intubating pt at this time. See respiratory documentation.

## 2014-03-13 DEATH — deceased

## 2014-03-27 NOTE — Discharge Summary (Signed)
DEATH SUMMARY  DATE OF ADMISSION:  03-04-14  DATE OF DISCHARGE/DEATH:  2014/03/04  ADMISSION DIAGNOSES:   Advanced stage rectal cancer - deemed unresectable Pneumoperitoneum Peritonitis Septic shock NSTEMI CHF Acute respiratory failure AKI Hypokalemia Acute blood loss anemia Thrombocytopenia Coagulopathy Hyperglycemia Septic encephalopathy   DISCHARGE DIAGNOSES:   Advanced stage rectal cancer - deemed unresectable Pneumoperitoneum Peritonitis Septic shock NSTEMI CHF Acute respiratory failure AKI Hypokalemia Acute blood loss anemia Thrombocytopenia Coagulopathy Hyperglycemia Septic encephalopathy   PRESENTATION:   Pt was admitted with the following HPI and the above admission diagnoses:  HISTORY OF PRESENT ILLNESS: Tammie Frey is a 54 F with CHF (EF unknown), and rectal cancer (presumably extensive stage) who was admitted to [facility] on 02/19/2014. There is minimal documentation from [facility] available but by report patient complained of nausea and abdominal pain earlier today and was found to have massive pneumoperitonum on X-ray. She was transferred to the ED for further evaluation. Surgery evaluated her and felt she was not a candidate for any surgical interventions. She was intubated.    HOSPITAL COURSE:     Cause of death:  Septic shock due to perforated viscus and peritonitis  Contributing factors: Rectal cancer    Merton Border, MD;  PCCM service; Mobile [phone]

## 2015-09-06 IMAGING — CR DG ABD PORTABLE 1V
1 series · 1 of 1 positions shown · non-contrast
Comparison: None.

CLINICAL DATA: Abdominal distention.  Diarrhea.

EXAM:
PORTABLE ABDOMEN - 1 VIEW

[AP]
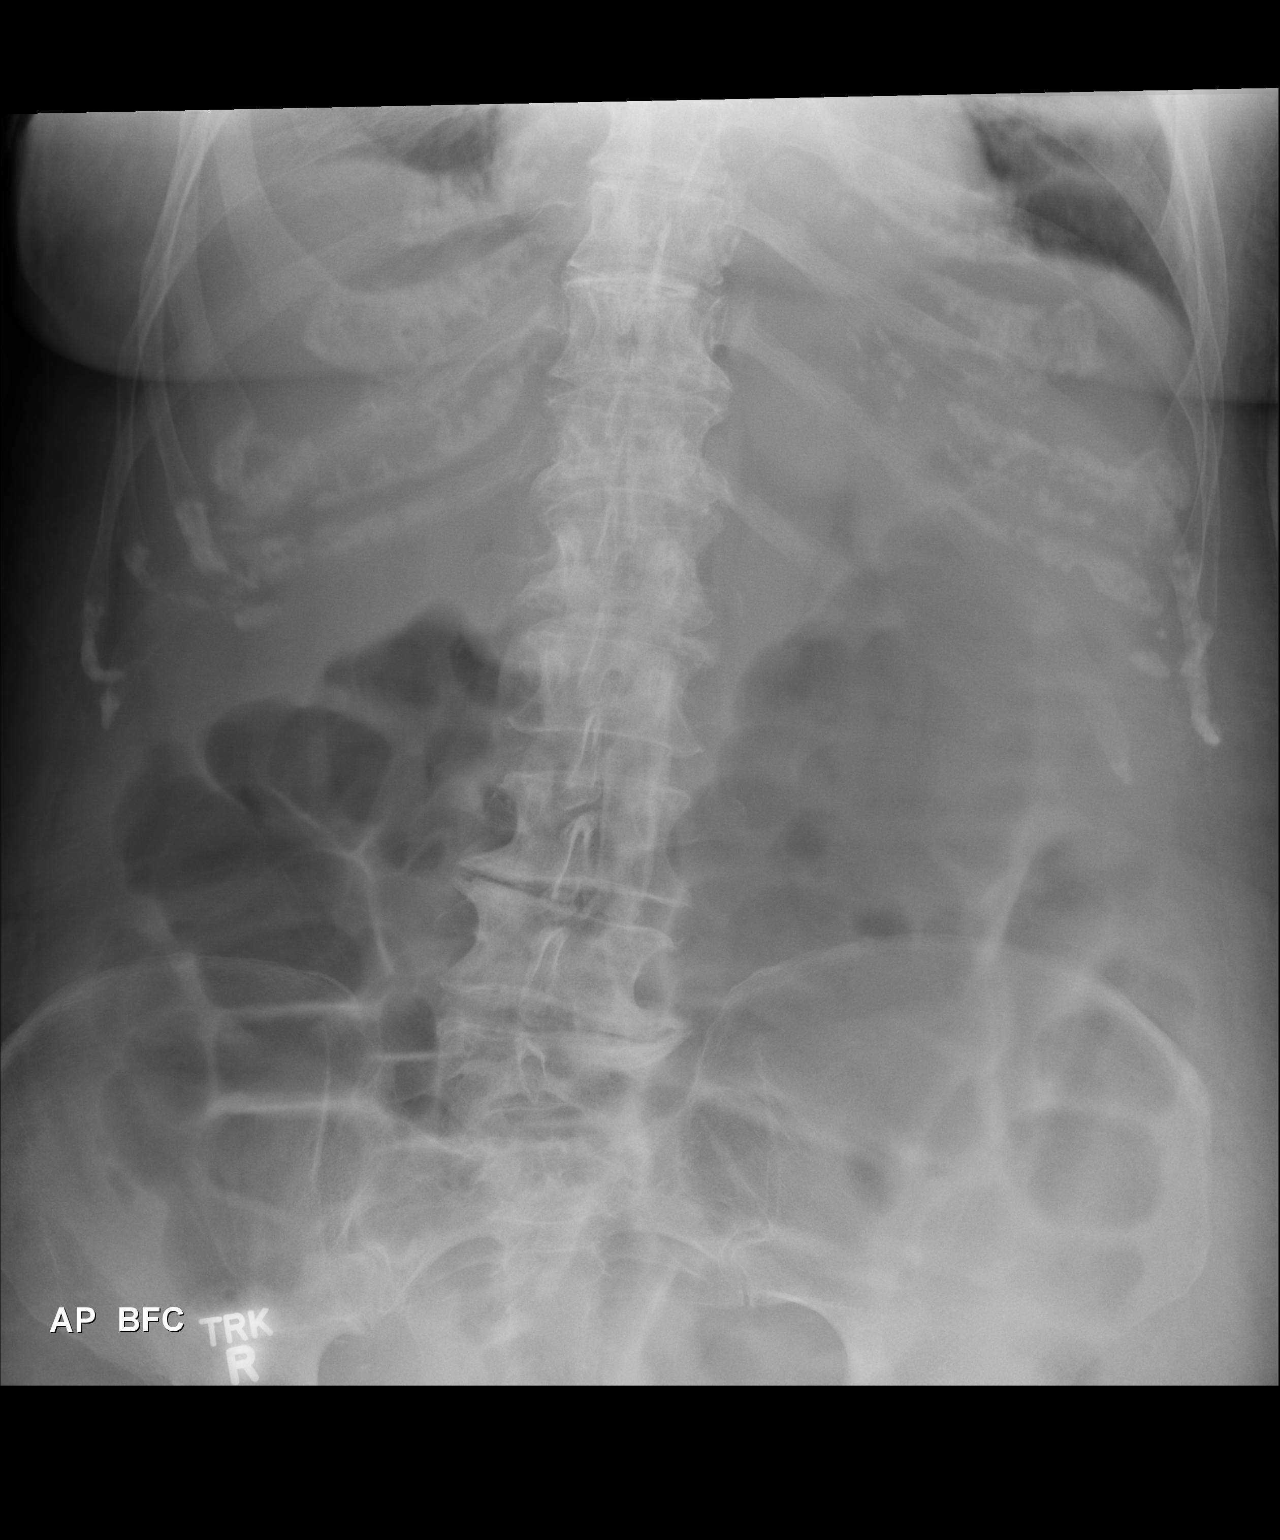

[1 of 1 positions shown; findings below may reference images not displayed]

FINDINGS: There is fairly extensive air which appears to be in the colon.
There are no abnormally distended bowel loops. There degenerative
changes throughout the lumbar spine. No abnormal abdominal
calcifications. Phleboliths in the pelvis.
IMPRESSION: Prominent air in the nondistended colon. No findings suggestive of
bowel obstruction.

## 2015-09-09 IMAGING — CR DG CHEST 1V PORT
1 series · 1 of 1 positions shown · non-contrast
Comparison: Portable exam 1832 hr compared 02/20/2014

CLINICAL DATA: Pleural effusion, followup

EXAM:
PORTABLE CHEST - 1 VIEW

[AP]
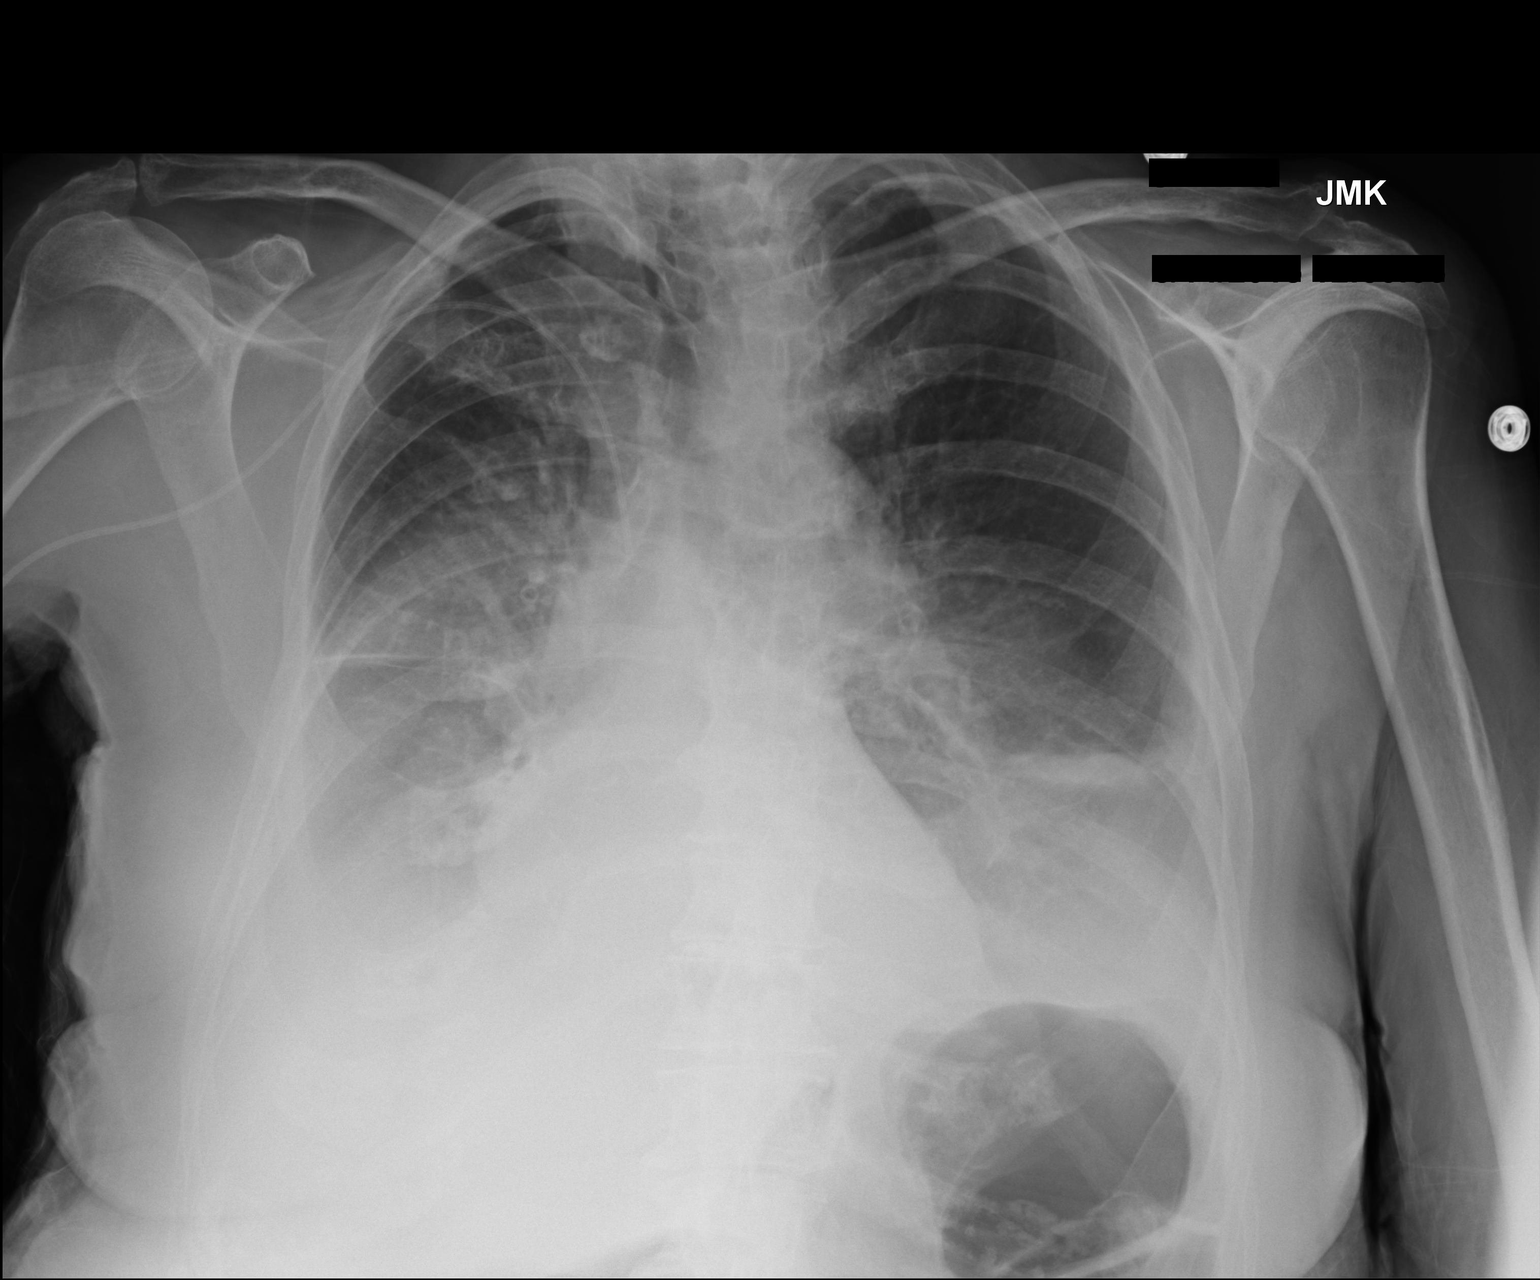

[1 of 1 positions shown; findings below may reference images not displayed]

FINDINGS: RIGHT arm PICC line tip projects over mid SVC.

Normal heart size and mediastinal contours.

Rotated to the RIGHT.

Bibasilar pleural effusions and atelectasis greater on RIGHT.

Upper lungs clear.

Central peribronchial thickening.

No pneumothorax or acute osseous findings.
IMPRESSION: Persistent bibasilar pleural effusions and atelectasis slightly
greater on RIGHT.

Bronchitic changes.

## 2015-09-10 IMAGING — CR DG ABD PORTABLE 1V
1 series · 1 of 1 positions shown · non-contrast
Comparison: 02/21/2014

CLINICAL DATA: Distended abdomen.

EXAM:
PORTABLE ABDOMEN - 1 VIEW

[AP]
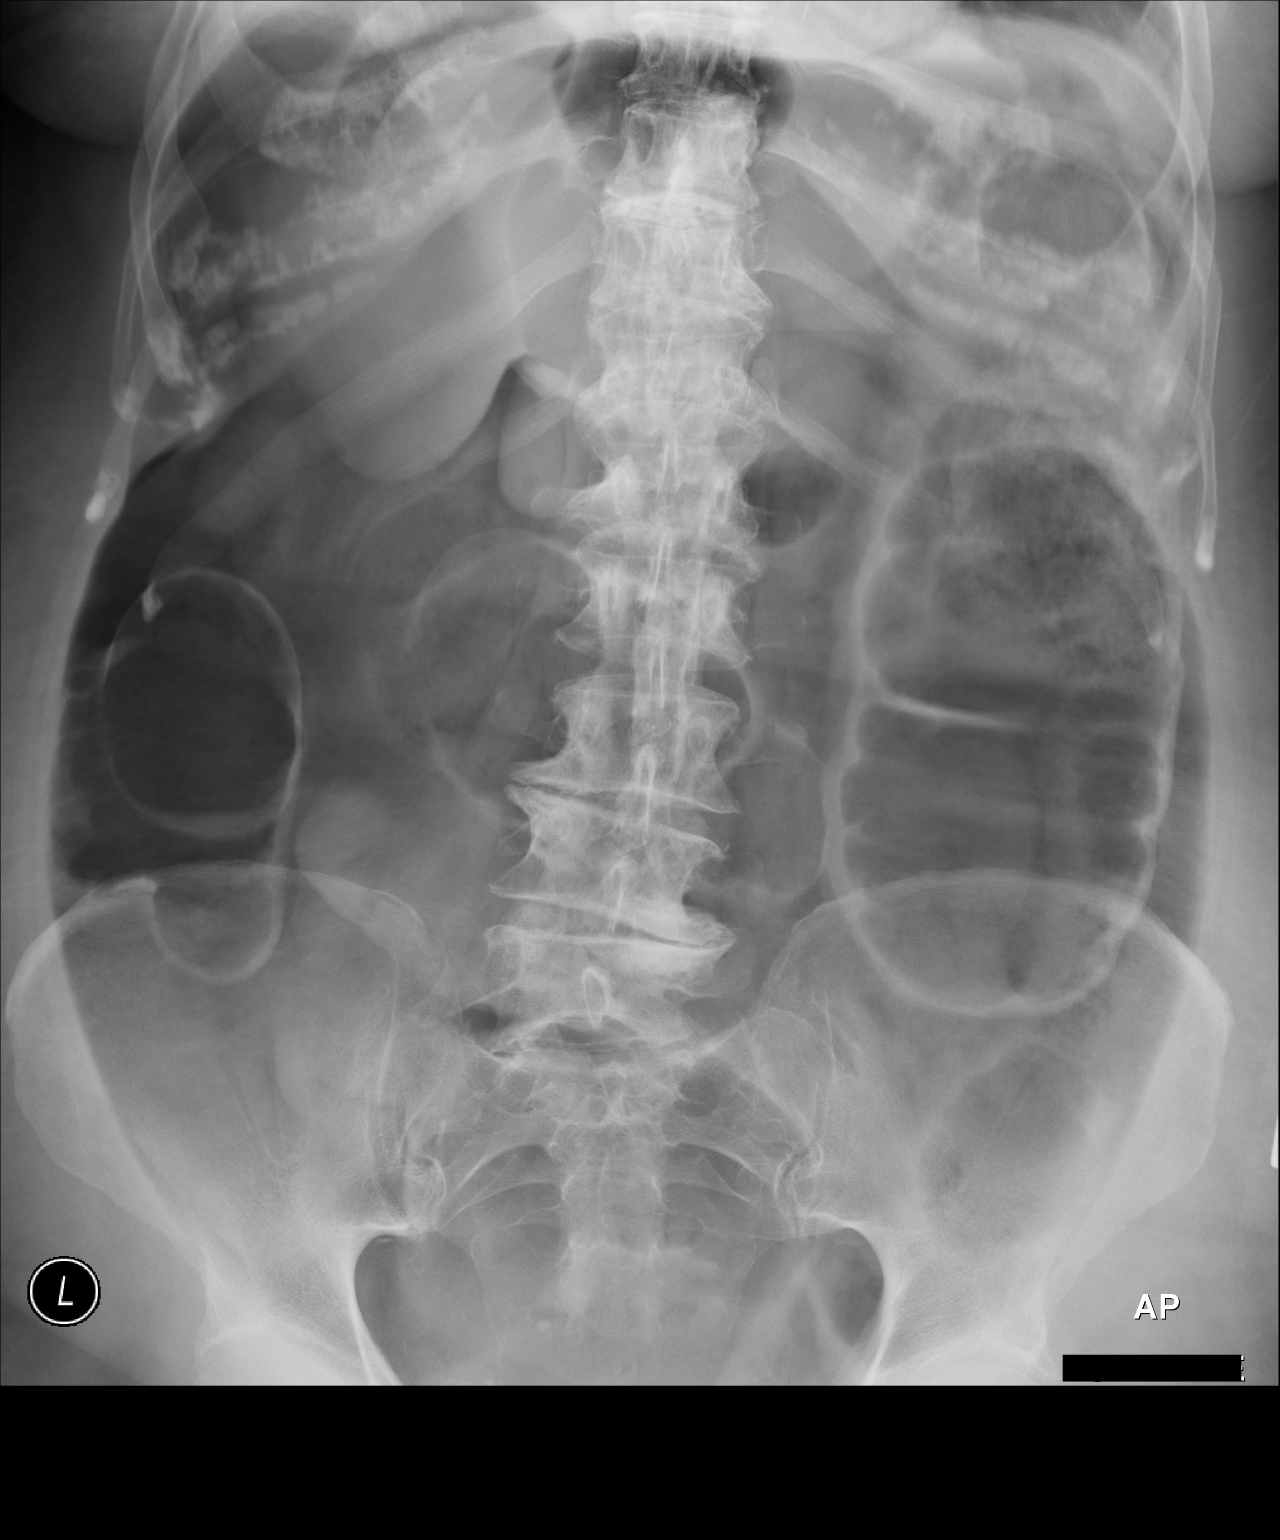

[1 of 1 positions shown; findings below may reference images not displayed]

FINDINGS: There is large volume pneumoperitoneum now present, new since prior
study. Gaseous distention of the colon has progressed since prior
study. No organomegaly. Degenerative changes in the lumbar spine.
IMPRESSION: Large volume pneumoperitoneum most compatible with bowel
perforation.

Critical Value/emergent results were called by telephone at the time
of interpretation on 02/24/2014 at [DATE] to patient's nurse, Locklear
who verbally acknowledged these results.
